# Patient Record
Sex: Female | Born: 1955 | Race: White | Hispanic: No | Marital: Married | Smoking: Never smoker
Health system: Southern US, Community
[De-identification: ages and names within clinical notes are randomized; demographics above are authoritative.]

## PROBLEM LIST (undated history)

## (undated) DIAGNOSIS — N952 Postmenopausal atrophic vaginitis: Secondary | ICD-10-CM

## (undated) DIAGNOSIS — M359 Systemic involvement of connective tissue, unspecified: Secondary | ICD-10-CM

## (undated) DIAGNOSIS — M3500 Sicca syndrome, unspecified: Secondary | ICD-10-CM

## (undated) DIAGNOSIS — I7300 Raynaud's syndrome without gangrene: Secondary | ICD-10-CM

## (undated) DIAGNOSIS — N879 Dysplasia of cervix uteri, unspecified: Secondary | ICD-10-CM

## (undated) DIAGNOSIS — M8580 Other specified disorders of bone density and structure, unspecified site: Secondary | ICD-10-CM

## (undated) HISTORY — DX: Other specified disorders of bone density and structure, unspecified site: M85.80

## (undated) HISTORY — DX: Systemic involvement of connective tissue, unspecified: M35.9

## (undated) HISTORY — DX: Dysplasia of cervix uteri, unspecified: N87.9

## (undated) HISTORY — PX: TUBAL LIGATION: SHX77

## (undated) HISTORY — DX: Postmenopausal atrophic vaginitis: N95.2

## (undated) HISTORY — PX: KNEE SURGERY: SHX244

## (undated) HISTORY — PX: CHOLECYSTECTOMY: SHX55

## (undated) HISTORY — PX: COLPOSCOPY: SHX161

## (undated) HISTORY — DX: Sjogren syndrome, unspecified: M35.00

## (undated) HISTORY — PX: BREAST EXCISIONAL BIOPSY: SUR124

## (undated) HISTORY — DX: Raynaud's syndrome without gangrene: I73.00

## (undated) HISTORY — PX: GYNECOLOGIC CRYOSURGERY: SHX857

## (undated) HISTORY — PX: APPENDECTOMY: SHX54

## (undated) HISTORY — PX: TONSILLECTOMY: SUR1361

---

## 1998-08-13 ENCOUNTER — Ambulatory Visit (HOSPITAL_COMMUNITY): Admission: RE | Admit: 1998-08-13 | Discharge: 1998-08-13 | Payer: Self-pay | Admitting: Family Medicine

## 1998-08-13 ENCOUNTER — Encounter: Payer: Self-pay | Admitting: Family Medicine

## 1999-03-09 ENCOUNTER — Ambulatory Visit (HOSPITAL_COMMUNITY): Admission: RE | Admit: 1999-03-09 | Discharge: 1999-03-09 | Payer: Self-pay | Admitting: *Deleted

## 1999-03-09 ENCOUNTER — Encounter: Payer: Self-pay | Admitting: *Deleted

## 2001-08-08 ENCOUNTER — Other Ambulatory Visit: Admission: RE | Admit: 2001-08-08 | Discharge: 2001-08-08 | Payer: Self-pay | Admitting: Obstetrics and Gynecology

## 2002-09-25 ENCOUNTER — Encounter: Payer: Self-pay | Admitting: Obstetrics and Gynecology

## 2002-09-25 ENCOUNTER — Ambulatory Visit (HOSPITAL_COMMUNITY): Admission: RE | Admit: 2002-09-25 | Discharge: 2002-09-25 | Payer: Self-pay | Admitting: Obstetrics and Gynecology

## 2002-09-30 ENCOUNTER — Other Ambulatory Visit: Admission: RE | Admit: 2002-09-30 | Discharge: 2002-09-30 | Payer: Self-pay | Admitting: Obstetrics and Gynecology

## 2003-11-04 ENCOUNTER — Other Ambulatory Visit: Admission: RE | Admit: 2003-11-04 | Discharge: 2003-11-04 | Payer: Self-pay | Admitting: Obstetrics and Gynecology

## 2004-01-13 ENCOUNTER — Ambulatory Visit (HOSPITAL_COMMUNITY): Admission: RE | Admit: 2004-01-13 | Discharge: 2004-01-13 | Payer: Self-pay | Admitting: Obstetrics and Gynecology

## 2004-12-20 ENCOUNTER — Other Ambulatory Visit: Admission: RE | Admit: 2004-12-20 | Discharge: 2004-12-20 | Payer: Self-pay | Admitting: Obstetrics and Gynecology

## 2005-01-18 ENCOUNTER — Ambulatory Visit (HOSPITAL_COMMUNITY): Admission: RE | Admit: 2005-01-18 | Discharge: 2005-01-18 | Payer: Self-pay | Admitting: Obstetrics and Gynecology

## 2006-01-05 ENCOUNTER — Other Ambulatory Visit: Admission: RE | Admit: 2006-01-05 | Discharge: 2006-01-05 | Payer: Self-pay | Admitting: Obstetrics and Gynecology

## 2006-01-20 ENCOUNTER — Ambulatory Visit (HOSPITAL_COMMUNITY): Admission: RE | Admit: 2006-01-20 | Discharge: 2006-01-20 | Payer: Self-pay | Admitting: Obstetrics and Gynecology

## 2007-01-11 ENCOUNTER — Other Ambulatory Visit: Admission: RE | Admit: 2007-01-11 | Discharge: 2007-01-11 | Payer: Self-pay | Admitting: Obstetrics and Gynecology

## 2007-01-22 ENCOUNTER — Ambulatory Visit (HOSPITAL_COMMUNITY): Admission: RE | Admit: 2007-01-22 | Discharge: 2007-01-22 | Payer: Self-pay | Admitting: Obstetrics and Gynecology

## 2008-01-23 ENCOUNTER — Encounter: Payer: Self-pay | Admitting: Obstetrics and Gynecology

## 2008-01-23 ENCOUNTER — Ambulatory Visit: Payer: Self-pay | Admitting: Obstetrics and Gynecology

## 2008-01-23 ENCOUNTER — Other Ambulatory Visit: Admission: RE | Admit: 2008-01-23 | Discharge: 2008-01-23 | Payer: Self-pay | Admitting: Obstetrics and Gynecology

## 2008-02-05 ENCOUNTER — Ambulatory Visit (HOSPITAL_COMMUNITY): Admission: RE | Admit: 2008-02-05 | Discharge: 2008-02-05 | Payer: Self-pay | Admitting: Obstetrics and Gynecology

## 2009-01-27 ENCOUNTER — Other Ambulatory Visit: Admission: RE | Admit: 2009-01-27 | Discharge: 2009-01-27 | Payer: Self-pay | Admitting: Obstetrics and Gynecology

## 2009-01-27 ENCOUNTER — Ambulatory Visit: Payer: Self-pay | Admitting: Obstetrics and Gynecology

## 2009-01-27 ENCOUNTER — Encounter: Payer: Self-pay | Admitting: Obstetrics and Gynecology

## 2009-02-13 ENCOUNTER — Ambulatory Visit (HOSPITAL_COMMUNITY): Admission: RE | Admit: 2009-02-13 | Discharge: 2009-02-13 | Payer: Self-pay | Admitting: Obstetrics and Gynecology

## 2010-02-16 ENCOUNTER — Ambulatory Visit (HOSPITAL_COMMUNITY): Admission: RE | Admit: 2010-02-16 | Discharge: 2010-02-16 | Payer: Self-pay | Admitting: Obstetrics and Gynecology

## 2010-03-11 ENCOUNTER — Other Ambulatory Visit: Admission: RE | Admit: 2010-03-11 | Discharge: 2010-03-11 | Payer: Self-pay | Admitting: Obstetrics and Gynecology

## 2010-03-11 ENCOUNTER — Ambulatory Visit: Payer: Self-pay | Admitting: Obstetrics and Gynecology

## 2011-01-12 ENCOUNTER — Other Ambulatory Visit: Payer: Self-pay | Admitting: Obstetrics and Gynecology

## 2011-01-12 DIAGNOSIS — Z1231 Encounter for screening mammogram for malignant neoplasm of breast: Secondary | ICD-10-CM

## 2011-02-18 ENCOUNTER — Ambulatory Visit (HOSPITAL_COMMUNITY)
Admission: RE | Admit: 2011-02-18 | Discharge: 2011-02-18 | Disposition: A | Discharge status: Home / Self Care | Payer: Managed Care, Other (non HMO) | Source: Ambulatory Visit | Attending: Obstetrics and Gynecology | Admitting: Obstetrics and Gynecology

## 2011-02-18 DIAGNOSIS — Z1231 Encounter for screening mammogram for malignant neoplasm of breast: Secondary | ICD-10-CM | POA: Insufficient documentation

## 2011-03-07 ENCOUNTER — Other Ambulatory Visit: Payer: Self-pay | Admitting: Dermatology

## 2012-01-18 ENCOUNTER — Other Ambulatory Visit: Payer: Self-pay | Admitting: Obstetrics and Gynecology

## 2012-01-18 DIAGNOSIS — Z1231 Encounter for screening mammogram for malignant neoplasm of breast: Secondary | ICD-10-CM

## 2012-02-21 ENCOUNTER — Ambulatory Visit (HOSPITAL_BASED_OUTPATIENT_CLINIC_OR_DEPARTMENT_OTHER)
Admission: RE | Admit: 2012-02-21 | Discharge: 2012-02-21 | Disposition: A | Discharge status: Home / Self Care | Payer: Managed Care, Other (non HMO) | Source: Ambulatory Visit | Attending: Obstetrics and Gynecology | Admitting: Obstetrics and Gynecology

## 2012-02-21 DIAGNOSIS — Z1231 Encounter for screening mammogram for malignant neoplasm of breast: Secondary | ICD-10-CM | POA: Insufficient documentation

## 2012-03-06 ENCOUNTER — Encounter: Payer: Self-pay | Admitting: Gynecology

## 2012-03-06 DIAGNOSIS — M8580 Other specified disorders of bone density and structure, unspecified site: Secondary | ICD-10-CM | POA: Insufficient documentation

## 2012-03-06 DIAGNOSIS — I7300 Raynaud's syndrome without gangrene: Secondary | ICD-10-CM | POA: Insufficient documentation

## 2012-03-06 DIAGNOSIS — N952 Postmenopausal atrophic vaginitis: Secondary | ICD-10-CM | POA: Insufficient documentation

## 2012-03-06 DIAGNOSIS — M3500 Sicca syndrome, unspecified: Secondary | ICD-10-CM | POA: Insufficient documentation

## 2012-03-06 DIAGNOSIS — M359 Systemic involvement of connective tissue, unspecified: Secondary | ICD-10-CM | POA: Insufficient documentation

## 2012-03-14 ENCOUNTER — Ambulatory Visit (INDEPENDENT_AMBULATORY_CARE_PROVIDER_SITE_OTHER): Payer: Managed Care, Other (non HMO) | Admitting: Obstetrics and Gynecology

## 2012-03-14 ENCOUNTER — Encounter: Payer: Self-pay | Admitting: Obstetrics and Gynecology

## 2012-03-14 ENCOUNTER — Other Ambulatory Visit (HOSPITAL_COMMUNITY)
Admission: RE | Admit: 2012-03-14 | Discharge: 2012-03-14 | Disposition: A | Discharge status: Home / Self Care | Payer: Managed Care, Other (non HMO) | Source: Ambulatory Visit | Attending: Obstetrics and Gynecology | Admitting: Obstetrics and Gynecology

## 2012-03-14 VITALS — BP 116/74 | Ht 64.0 in | Wt 122.0 lb

## 2012-03-14 DIAGNOSIS — N879 Dysplasia of cervix uteri, unspecified: Secondary | ICD-10-CM | POA: Insufficient documentation

## 2012-03-14 DIAGNOSIS — Z01419 Encounter for gynecological examination (general) (routine) without abnormal findings: Secondary | ICD-10-CM

## 2012-03-14 MED ORDER — ESTRADIOL 2 MG VA RING: 2.0000 mg | VAGINAL_RING | VAGINAL | Status: DC

## 2012-03-14 NOTE — Progress Notes (Signed)
Patient came to see me today for her annual GYN exam. She has been using estrogen cream for atrophic vaginitis but has trouble remembering that. She is doing fine in terms of hormonal systemic symptoms. Her last Pap smear was 2011. 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 Her last Pap was 2011. She is not having vaginal bleeding. She is not having pelvic pain. She had a bone density in 2010 showing moderate osteopenia.She is up-to-date on mammograms.  Physical examination:Monica Deleon present. HEENT within normal limits. Neck: Thyroid not large. No masses. Supraclavicular nodes: not enlarged. Breasts: Examined in both sitting and lying  position. No skin changes and no masses. Abdomen: Soft no guarding rebound or masses or hernia. Pelvic: External: Within normal limits. BUS: Within normal limits. Vaginal:within normal limits. Fair estrogen effect. No evidence of cystocele rectocele or enterocele. Cervix: clean. Uterus: Normal size and shape. Adnexa: No masses. Rectovaginal exam: Confirmatory and negative. Extremities: Within normal limits.  Assessment: Atrophic vaginitis. CIN-1. Osteopenia.  Plan: Switched her to Estring vaginal ring. Bone density. Continue yearly mammograms. Pap done.The new Pap smear guidelines were discussed with the patient.

## 2012-03-14 NOTE — Patient Instructions (Signed)
Schedule bone density.    

## 2012-03-15 LAB — URINALYSIS W MICROSCOPIC + REFLEX CULTURE
Bilirubin Urine: NEGATIVE
Casts: NONE SEEN
Hgb urine dipstick: NEGATIVE
Leukocytes, UA: NEGATIVE

## 2012-11-23 ENCOUNTER — Encounter: Payer: Self-pay | Admitting: *Deleted

## 2013-07-02 ENCOUNTER — Other Ambulatory Visit: Payer: Self-pay | Admitting: Gynecology

## 2013-07-02 DIAGNOSIS — Z1231 Encounter for screening mammogram for malignant neoplasm of breast: Secondary | ICD-10-CM

## 2013-07-03 ENCOUNTER — Ambulatory Visit (HOSPITAL_BASED_OUTPATIENT_CLINIC_OR_DEPARTMENT_OTHER)
Admission: RE | Admit: 2013-07-03 | Discharge: 2013-07-03 | Disposition: A | Discharge status: Home / Self Care | Payer: Managed Care, Other (non HMO) | Source: Ambulatory Visit | Attending: Gynecology | Admitting: Gynecology

## 2013-07-03 DIAGNOSIS — Z1231 Encounter for screening mammogram for malignant neoplasm of breast: Secondary | ICD-10-CM

## 2014-02-24 ENCOUNTER — Encounter: Payer: Self-pay | Admitting: Obstetrics and Gynecology

## 2014-08-21 ENCOUNTER — Other Ambulatory Visit (HOSPITAL_COMMUNITY)
Admission: RE | Admit: 2014-08-21 | Discharge: 2014-08-21 | Disposition: A | Discharge status: Home / Self Care | Payer: Managed Care, Other (non HMO) | Source: Ambulatory Visit | Attending: Gynecology | Admitting: Gynecology

## 2014-08-21 ENCOUNTER — Encounter: Payer: Self-pay | Admitting: Women's Health

## 2014-08-21 ENCOUNTER — Ambulatory Visit (INDEPENDENT_AMBULATORY_CARE_PROVIDER_SITE_OTHER): Payer: Managed Care, Other (non HMO) | Admitting: Women's Health

## 2014-08-21 VITALS — BP 120/76 | Ht 64.0 in | Wt 126.0 lb

## 2014-08-21 DIAGNOSIS — Z79890 Hormone replacement therapy (postmenopausal): Secondary | ICD-10-CM | POA: Diagnosis not present

## 2014-08-21 DIAGNOSIS — Z01419 Encounter for gynecological examination (general) (routine) without abnormal findings: Secondary | ICD-10-CM

## 2014-08-21 DIAGNOSIS — M8580 Other specified disorders of bone density and structure, unspecified site: Secondary | ICD-10-CM

## 2014-08-21 DIAGNOSIS — Z13820 Encounter for screening for osteoporosis: Secondary | ICD-10-CM

## 2014-08-21 MED ORDER — ESTRADIOL 2 MG VA RING: 2.0000 mg | VAGINAL_RING | VAGINAL | Status: DC

## 2014-08-21 NOTE — Patient Instructions (Signed)
Health Recommendations for Postmenopausal Women Respected and ongoing research has looked at the most common causes of death, disability, and poor quality of life in postmenopausal women. The causes include heart disease, diseases of blood vessels, diabetes, depression, cancer, and bone loss (osteoporosis). Many things can be done to help lower the chances of developing these and other common problems. CARDIOVASCULAR DISEASE Heart Disease: A heart attack is a medical emergency. Know the signs and symptoms of a heart attack. Below are things women can do to reduce their risk for heart disease.   Do not smoke. If you smoke, quit.  Aim for a healthy weight. Being overweight causes many preventable deaths. Eat a healthy and balanced diet and drink an adequate amount of liquids.  Get moving. Make a commitment to be more physically active. Aim for 30 minutes of activity on most, if not all days of the week.  Eat for heart health. Choose a diet that is low in saturated fat and cholesterol and eliminate trans fat. Include whole grains, vegetables, and fruits. Read and understand the labels on food containers before buying.  Know your numbers. Ask your caregiver to check your blood pressure, cholesterol (total, HDL, LDL, triglycerides) and blood glucose. Work with your caregiver on improving your entire clinical picture.  High blood pressure. Limit or stop your table salt intake (try salt substitute and food seasonings). Avoid salty foods and drinks. Read labels on food containers before buying. Eating well and exercising can help control high blood pressure. STROKE  Stroke is a medical emergency. Stroke may be the result of a blood clot in a blood vessel in the brain or by a brain hemorrhage (bleeding). Know the signs and symptoms of a stroke. To lower the risk of developing a stroke:  Avoid fatty foods.  Quit smoking.  Control your diabetes, blood pressure, and irregular heart rate. THROMBOPHLEBITIS  (BLOOD CLOT) OF THE LEG  Becoming overweight and leading a stationary lifestyle may also contribute to developing blood clots. Controlling your diet and exercising will help lower the risk of developing blood clots. CANCER SCREENING  Breast Cancer: Take steps to reduce your risk of breast cancer.  You should practice "breast self-awareness." This means understanding the normal appearance and feel of your breasts and should include breast self-examination. Any changes detected, no matter how small, should be reported to your caregiver.  After age 40, you should have a clinical breast exam (CBE) every year.  Starting at age 40, you should consider having a mammogram (breast X-ray) every year.  If you have a family history of breast cancer, talk to your caregiver about genetic screening.  If you are at high risk for breast cancer, talk to your caregiver about having an MRI and a mammogram every year.  Intestinal or Stomach Cancer: Tests to consider are a rectal exam, fecal occult blood, sigmoidoscopy, and colonoscopy. Women who are high risk may need to be screened at an earlier age and more often.  Cervical Cancer:  Beginning at age 30, you should have a Pap test every 3 years as long as the past 3 Pap tests have been normal.  If you have had past treatment for cervical cancer or a condition that could lead to cancer, you need Pap tests and screening for cancer for at least 20 years after your treatment.  If you had a hysterectomy for a problem that was not cancer or a condition that could lead to cancer, then you no longer need Pap tests.    If you are between ages 65 and 70, and you have had normal Pap tests going back 10 years, you no longer need Pap tests.  If Pap tests have been discontinued, risk factors (such as a new sexual partner) need to be reassessed to determine if screening should be resumed.  Some medical problems can increase the chance of getting cervical cancer. In these  cases, your caregiver may recommend more frequent screening and Pap tests.  Uterine Cancer: If you have vaginal bleeding after reaching menopause, you should notify your caregiver.  Ovarian Cancer: Other than yearly pelvic exams, there are no reliable tests available to screen for ovarian cancer at this time except for yearly pelvic exams.  Lung Cancer: Yearly chest X-rays can detect lung cancer and should be done on high risk women, such as cigarette smokers and women with chronic lung disease (emphysema).  Skin Cancer: A complete body skin exam should be done at your yearly examination. Avoid overexposure to the sun and ultraviolet light lamps. Use a strong sun block cream when in the sun. All of these things are important for lowering the risk of skin cancer. MENOPAUSE Menopause Symptoms: Hormone therapy products are effective for treating symptoms associated with menopause:  Moderate to severe hot flashes.  Night sweats.  Mood swings.  Headaches.  Tiredness.  Loss of sex drive.  Insomnia.  Other symptoms. Hormone replacement carries certain risks, especially in older women. Women who use or are thinking about using estrogen or estrogen with progestin treatments should discuss that with their caregiver. Your caregiver will help you understand the benefits and risks. The ideal dose of hormone replacement therapy is not known. The Food and Drug Administration (FDA) has concluded that hormone therapy should be used only at the lowest doses and for the shortest amount of time to reach treatment goals.  OSTEOPOROSIS Protecting Against Bone Loss and Preventing Fracture If you use hormone therapy for prevention of bone loss (osteoporosis), the risks for bone loss must outweigh the risk of the therapy. Ask your caregiver about other medications known to be safe and effective for preventing bone loss and fractures. To guard against bone loss or fractures, the following is recommended:  If  you are younger than age 50, take 1000 mg of calcium and at least 600 mg of Vitamin D per day.  If you are older than age 50 but younger than age 70, take 1200 mg of calcium and at least 600 mg of Vitamin D per day.  If you are older than age 70, take 1200 mg of calcium and at least 800 mg of Vitamin D per day. Smoking and excessive alcohol intake increases the risk of osteoporosis. Eat foods rich in calcium and vitamin D and do weight bearing exercises several times a week as your caregiver suggests. DIABETES Diabetes Mellitus: If you have type I or type 2 diabetes, you should keep your blood sugar under control with diet, exercise, and recommended medication. Avoid starchy and fatty foods, and too many sweets. Being overweight can make diabetes control more difficult. COGNITION AND MEMORY Cognition and Memory: Menopausal hormone therapy is not recommended for the prevention of cognitive disorders such as Alzheimer's disease or memory loss.  DEPRESSION  Depression may occur at any age, but it is common in elderly women. This may be because of physical, medical, social (loneliness), or financial problems and needs. If you are experiencing depression because of medical problems and control of symptoms, talk to your caregiver about this. Physical   activity and exercise may help with mood and sleep. Community and volunteer involvement may improve your sense of value and worth. If you have depression and you feel that the problem is getting worse or becoming severe, talk to your caregiver about which treatment options are [place] for you. ACCIDENTS  Accidents are common and can be serious in elderly woman. Prepare your house to prevent accidents. Eliminate throw rugs, place hand bars in bath, shower, and toilet areas. Avoid wearing high heeled shoes or walking on wet, snowy, and icy areas. Limit or stop driving if you have vision or hearing problems, or if you feel you are unsteady with your movements and  reflexes. HEPATITIS C Hepatitis C is a type of viral infection affecting the liver. It is spread mainly through contact with blood from an infected person. It can be treated, but if left untreated, it can lead to severe liver damage over the years. Many people who are infected do not know that the virus is in their blood. If you are a "baby-boomer", it is recommended that you have one screening test for Hepatitis C. IMMUNIZATIONS  Several immunizations are important to consider having during your senior years, including:   Tetanus, diphtheria, and pertussis booster shot.  Influenza every year before the flu season begins.  Pneumonia vaccine.  Shingles vaccine.  Others, as indicated based on your specific needs. Talk to your caregiver about these. Document Released: 06/03/2005 Document Revised: 08/26/2013 Document Reviewed: 01/28/2008 ExitCare Patient Information 2015 [employer]. This information is not intended to replace advice given to you by your health care provider. Make sure you discuss any questions you have with your health care provider.  

## 2014-08-21 NOTE — Progress Notes (Signed)
[place] Aug 08, 1955 [id number]    History:    Presents for annual exam.  Postmenopausal/no HRT/no bleeding. Had been on estrogen cream had stopped/ ran out of the prescription, increased vaginal dryness. Last here 2013. Had normal labs at work overall cholesterol 132, glucose 85. Rheumatologist also does labs at have been normal. 2010 T score -0.2 at spine, left femoral neck -1.6 FRAX 4.8%/0.5%. Raynaud's syndrome connective tissue disease and Sjogren's syndrome. Negative colonoscopy age 59  Past medical history, past surgical history, family history and social history were all reviewed and documented in the EPIC chart. 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 History of BTL, appendectomy, cholecystectomy and tonsillectomy.  ROS:  A ROS was performed and pertinent positives and negatives are included.  Exam:  Filed Vitals:   08/21/14 0810  BP: 120/76    General appearance:  Normal Thyroid:  Symmetrical, normal in size, without palpable masses or nodularity. Respiratory  Auscultation:  Clear without wheezing or rhonchi Cardiovascular  Auscultation:  Regular rate, without rubs, murmurs or gallops  Edema/varicosities:  Not grossly evident Abdominal  Soft,nontender, without masses, guarding or rebound.  Liver/spleen:  No organomegaly noted  Hernia:  None appreciated  Skin  Inspection:  Grossly normal   Breasts: Examined lying and sitting.     Right: Without masses, retractions, discharge or axillary adenopathy.     Left: Without masses, retractions, discharge or axillary adenopathy. Gentitourinary   Inguinal/mons:  Normal without inguinal adenopathy  External genitalia:  Normal  BUS/Urethra/Skene's glands:  Normal  Vagina: Atrophic  Cervix:  Normal  Uterus: normal in size, shape and contour.  Midline and mobile  Adnexa/parametria:     Rt: Without masses or tenderness.   Lt: Without masses or tenderness.  Anus and perineum: Normal  Digital rectal exam: Normal  sphincter tone without palpated masses or tenderness  Assessment/Plan:  59 y.o. MWF G2P2 for annual exam with complaint of vaginal dryness and intermittent upper abdominal pain.  Atrophic vaginitis Osteopenia without elevated FRAX Raynaud's syndrome connective tissue disorder, and Sjogren's syndrome managed by rheumatologist Upper abdominal pain  Plan: Repeat DEXA, will schedule,  Requested abdominal CT scan, [facility]. SBE's, continue annual screening mammogram, 3-D tomography reviewed and encouraged history of dense breasts. Vaginal dryness discussed, will try Estring, prescription, proper use given and reviewed will call if no relief of dryness, continue vaginal lubricants with intercourse. Reviewed minimal systemic absorption, slight risk for blood clots and strokes. Home safety, fall prevention and importance of continued regular exercise. Labs primary care and rheumatologist. UA, Pap with HR HPV typing, new screening guidelines reviewed.       Harrington ChallengerYOUNG,NANCY J [facility] CtrWHNP, 10:39 AM 08/21/2014

## 2014-08-22 LAB — URINALYSIS W MICROSCOPIC + REFLEX CULTURE
BILIRUBIN URINE: NEGATIVE
Bacteria, UA: NONE SEEN
CASTS: NONE SEEN
CRYSTALS: NONE SEEN
Glucose, UA: NEGATIVE mg/dL
HGB URINE DIPSTICK: NEGATIVE
KETONES UR: NEGATIVE mg/dL
Leukocytes, UA: NEGATIVE
NITRITE: NEGATIVE
PH: 6 (ref 5.0–8.0)
Protein, ur: NEGATIVE mg/dL
Specific Gravity, Urine: <1.005 — ABNORMAL LOW (ref 1.005–1.030)
Squamous Epithelial / LPF: NONE SEEN
Urobilinogen, UA: 0.2 mg/dL (ref 0.0–1.0)

## 2014-08-22 LAB — CYTOLOGY - PAP

## 2014-08-27 ENCOUNTER — Telehealth: Payer: Self-pay | Admitting: *Deleted

## 2014-08-27 ENCOUNTER — Other Ambulatory Visit: Payer: Self-pay | Admitting: Women's Health

## 2014-08-27 DIAGNOSIS — G8929 Other chronic pain: Secondary | ICD-10-CM

## 2014-08-27 DIAGNOSIS — R1011 Right upper quadrant pain: Principal | ICD-10-CM

## 2014-08-27 DIAGNOSIS — Z1231 Encounter for screening mammogram for malignant neoplasm of breast: Secondary | ICD-10-CM

## 2014-08-27 NOTE — Telephone Encounter (Signed)
-----   Message from Harrington ChallengerNancy J Young, NP sent at 08/22/2014  1:52 PM EDT ----- Please schedule CT with contrast of abdomen, has diffuse intermittent abdominal pain for past 2 months, minimal pelvic discomfort. No GI or GU symptoms. Can go most any time for appt..  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

## 2014-08-27 NOTE — Telephone Encounter (Signed)
Appointment scheduled on 09/02/14 @ 10:30am check in at 10:15am, NPO 4 hours prior to scan, will need to pick up contrast with instructions on when to drink contrast. Pt aware with this as well.

## 2014-09-02 ENCOUNTER — Telehealth: Payer: Self-pay

## 2014-09-02 ENCOUNTER — Ambulatory Visit (HOSPITAL_COMMUNITY): Payer: Managed Care, Other (non HMO)

## 2014-09-02 NOTE — Telephone Encounter (Signed)
I called [employer] and set up case for prior authorization. Clinical notes faxed to them.  Today I received notice of prior auth #[id number] valid until 12/01/14.

## 2014-09-05 ENCOUNTER — Ambulatory Visit (HOSPITAL_COMMUNITY)
Admission: RE | Admit: 2014-09-05 | Discharge: 2014-09-05 | Disposition: A | Discharge status: Home / Self Care | Payer: Managed Care, Other (non HMO) | Source: Ambulatory Visit | Attending: Women's Health | Admitting: Women's Health

## 2014-09-05 DIAGNOSIS — Z780 Asymptomatic menopausal state: Secondary | ICD-10-CM | POA: Diagnosis not present

## 2014-09-05 DIAGNOSIS — Z1231 Encounter for screening mammogram for malignant neoplasm of breast: Secondary | ICD-10-CM | POA: Insufficient documentation

## 2014-09-05 DIAGNOSIS — M8580 Other specified disorders of bone density and structure, unspecified site: Secondary | ICD-10-CM

## 2014-09-05 DIAGNOSIS — Z13820 Encounter for screening for osteoporosis: Secondary | ICD-10-CM | POA: Insufficient documentation

## 2014-09-10 ENCOUNTER — Encounter (HOSPITAL_COMMUNITY): Payer: Self-pay

## 2014-09-10 ENCOUNTER — Ambulatory Visit (HOSPITAL_COMMUNITY)
Admission: RE | Admit: 2014-09-10 | Discharge: 2014-09-10 | Disposition: A | Discharge status: Home / Self Care | Payer: Managed Care, Other (non HMO) | Source: Ambulatory Visit | Attending: Women's Health | Admitting: Women's Health

## 2014-09-10 DIAGNOSIS — G8929 Other chronic pain: Secondary | ICD-10-CM | POA: Diagnosis not present

## 2014-09-10 DIAGNOSIS — R1010 Upper abdominal pain, unspecified: Secondary | ICD-10-CM | POA: Diagnosis not present

## 2014-09-10 DIAGNOSIS — R1011 Right upper quadrant pain: Secondary | ICD-10-CM

## 2014-09-10 MED ORDER — IOHEXOL 300 MG/ML  SOLN
100.0000 mL | Freq: Once | INTRAMUSCULAR | Status: AC | PRN
  Administered 2014-09-10: 100 mL via INTRAVENOUS

## 2014-10-24 ENCOUNTER — Telehealth: Payer: Self-pay | Admitting: Women's Health

## 2014-10-24 NOTE — Telephone Encounter (Signed)
Telephone call to review DEXA. Does see a rheumatologist also. AP spine T score -0.5, left femoral neck -2.1 had been -1.6  6 years ago. Has a very active lifestyle with sports and regular exercise. We'll continue regular exercise, calcium rich diet will have a vitamin D checked in July with rheumatologist. Home safety and fall prevention discussed.

## 2015-10-15 ENCOUNTER — Other Ambulatory Visit: Payer: Self-pay | Admitting: Women's Health

## 2015-10-15 DIAGNOSIS — Z1231 Encounter for screening mammogram for malignant neoplasm of breast: Secondary | ICD-10-CM

## 2015-11-02 ENCOUNTER — Ambulatory Visit: Payer: Managed Care, Other (non HMO)

## 2015-11-03 ENCOUNTER — Ambulatory Visit: Payer: Managed Care, Other (non HMO)

## 2015-11-17 ENCOUNTER — Ambulatory Visit
Admission: RE | Admit: 2015-11-17 | Discharge: 2015-11-17 | Disposition: A | Discharge status: Home / Self Care | Payer: Managed Care, Other (non HMO) | Source: Ambulatory Visit | Attending: Women's Health | Admitting: Women's Health

## 2015-11-17 DIAGNOSIS — Z1231 Encounter for screening mammogram for malignant neoplasm of breast: Secondary | ICD-10-CM

## 2016-12-22 ENCOUNTER — Other Ambulatory Visit: Payer: Self-pay | Admitting: Women's Health

## 2016-12-22 DIAGNOSIS — Z1231 Encounter for screening mammogram for malignant neoplasm of breast: Secondary | ICD-10-CM

## 2017-01-04 ENCOUNTER — Ambulatory Visit
Admission: RE | Admit: 2017-01-04 | Discharge: 2017-01-04 | Disposition: A | Discharge status: Home / Self Care | Payer: Managed Care, Other (non HMO) | Source: Ambulatory Visit | Attending: Women's Health | Admitting: Women's Health

## 2017-01-04 DIAGNOSIS — Z1231 Encounter for screening mammogram for malignant neoplasm of breast: Secondary | ICD-10-CM

## 2018-01-23 ENCOUNTER — Other Ambulatory Visit: Payer: Self-pay | Admitting: Women's Health

## 2018-01-23 DIAGNOSIS — Z1231 Encounter for screening mammogram for malignant neoplasm of breast: Secondary | ICD-10-CM

## 2018-02-26 ENCOUNTER — Ambulatory Visit
Admission: RE | Admit: 2018-02-26 | Discharge: 2018-02-26 | Disposition: A | Discharge status: Home / Self Care | Payer: 59 | Source: Ambulatory Visit | Attending: Women's Health | Admitting: Women's Health

## 2018-02-26 DIAGNOSIS — Z1231 Encounter for screening mammogram for malignant neoplasm of breast: Secondary | ICD-10-CM

## 2018-07-03 ENCOUNTER — Ambulatory Visit (INDEPENDENT_AMBULATORY_CARE_PROVIDER_SITE_OTHER): Payer: 59 | Admitting: Women's Health

## 2018-07-03 ENCOUNTER — Encounter: Payer: Self-pay | Admitting: Women's Health

## 2018-07-03 VITALS — BP 124/80 | Ht 64.0 in | Wt 132.0 lb

## 2018-07-03 DIAGNOSIS — Z01419 Encounter for gynecological examination (general) (routine) without abnormal findings: Secondary | ICD-10-CM

## 2018-07-03 DIAGNOSIS — Z13820 Encounter for screening for osteoporosis: Secondary | ICD-10-CM | POA: Diagnosis not present

## 2018-07-03 MED ORDER — ESTRADIOL 10 MCG VA TABS: ORAL_TABLET | VAGINAL | 4 refills | Status: DC

## 2018-07-03 NOTE — Progress Notes (Signed)
Monica Deleon 1955-04-30 [id number]    History:    Presents for annual exam.  Menopausal on no HRT with no bleeding.  Normal Pap and mammogram history.  Labs at primary care.  Negative colonoscopy at age 63.  Sjogren's and Raynaud  primary care manages.  Has not had Shingrix.  Past medical history, past surgical history, family history and social history were all reviewed and documented in the EPIC chart.  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  ROS:  A ROS was performed and pertinent positives and negatives are included.  Exam:  Vitals:   07/03/18 1100  BP: 124/80  Weight: 132 lb (59.9 kg)  Height: 5\' 4"  (1.626 m)   Body mass index is 22.66 kg/m.   General appearance:  Normal Thyroid:  Symmetrical, normal in size, without palpable masses or nodularity. Respiratory  Auscultation:  Clear without wheezing or rhonchi Cardiovascular  Auscultation:  Regular rate, without rubs, murmurs or gallops  Edema/varicosities:  Not grossly evident Abdominal  Soft,nontender, without masses, guarding or rebound.  Liver/spleen:  No organomegaly noted  Hernia:  None appreciated  Skin  Inspection:  Grossly normal   Breasts: Examined lying and sitting.     Right: Without masses, retractions, discharge or axillary adenopathy.     Left: Without masses, retractions, discharge or axillary adenopathy. Gentitourinary   Inguinal/mons:  Normal without inguinal adenopathy  External genitalia:  Normal  BUS/Urethra/Skene's glands:  Normal  Vagina: Atrophic  Cervix:  Normal  Uterus:   normal in size, shape and contour.  Midline and mobile  Adnexa/parametria:     Rt: Without masses or tenderness.   Lt: Without masses or tenderness.  Anus and perineum: Normal  Digital rectal exam: Normal sphincter tone without palpated masses or tenderness  Assessment/Plan:  63 y.o. MWF G2 P2 for annual exam with complaint of vaginal dryness.  Postmenopausal on no HRT with no bleeding Vaginal  atrophy/dyspareunia Sjogren's and Raynaud-primary care manages labs and meds  Plan: Options for vaginal dryness reviewed, Vagifem 1 applicator at bedtime for 2 weeks and then twice weekly per vagina.  Continue over-the-counter vaginal lubricants with intercourse.  SBEs, continue annual screening mammogram, calcium rich foods, vitamin D 2000 daily encouraged.  Home safety, fall prevention and importance of weightbearing and balance type exercise reviewed and encouraged.  Shingrix discussed and Courage.  Will repeat DEXA with next mammogram.  Pap with HR HPV typing, new screening guidelines reviewed.   Harrington Challenger [facility], 1:14 PM 07/03/2018

## 2018-07-03 NOTE — Patient Instructions (Signed)
[facility]  Health Maintenance for Postmenopausal Women Menopause is a normal process in which your reproductive ability comes to an end. This process happens gradually over a span of months to years, usually between the ages of 69 and 60. Menopause is complete when you have missed 12 consecutive menstrual periods. It is important to talk with your health care provider about some of the most common conditions that affect postmenopausal women, such as heart disease, cancer, and bone loss (osteoporosis). Adopting a healthy lifestyle and getting preventive care can help to promote your health and wellness. Those actions can also lower your chances of developing some of these common conditions. What should I know about menopause? During menopause, you may experience a number of symptoms, such as:  Moderate-to-severe hot flashes.  Night sweats.  Decrease in sex drive.  Mood swings.  Headaches.  Tiredness.  Irritability.  Memory problems.  Insomnia. Choosing to treat or not to treat menopausal changes is an individual decision that you make with your health care provider. What should I know about hormone replacement therapy and supplements? Hormone therapy products are effective for treating symptoms that are associated with menopause, such as hot flashes and night sweats. Hormone replacement carries certain risks, especially as you become older. If you are thinking about using estrogen or estrogen with progestin treatments, discuss the benefits and risks with your health care provider. What should I know about heart disease and stroke? Heart disease, heart attack, and stroke become more likely as you age. This may be due, in part, to the hormonal changes that your body experiences during menopause. These can affect how your body processes dietary fats, triglycerides, and cholesterol. Heart attack and stroke are both medical emergencies. There are many things that you can do to help prevent  heart disease and stroke:  Have your blood pressure checked at least every 1-2 years. High blood pressure causes heart disease and increases the risk of stroke.  If you are 32-66 years old, ask your health care provider if you should take aspirin to prevent a heart attack or a stroke.  Do not use any tobacco products, including cigarettes, chewing tobacco, or electronic cigarettes. If you need help quitting, ask your health care provider.  It is important to eat a healthy diet and maintain a healthy weight. ? Be sure to include plenty of vegetables, fruits, low-fat dairy products, and lean protein. ? Avoid eating foods that are high in solid fats, added sugars, or salt (sodium).  Get regular exercise. This is one of the most important things that you can do for your health. ? Try to exercise for at least 150 minutes each week. The type of exercise that you do should increase your heart rate and make you sweat. This is known as moderate-intensity exercise. ? Try to do strengthening exercises at least twice each week. Do these in addition to the moderate-intensity exercise.  Know your numbers.Ask your health care provider to check your cholesterol and your blood glucose. Continue to have your blood tested as directed by your health care provider.  What should I know about cancer screening? There are several types of cancer. Take the following steps to reduce your risk and to catch any cancer development as early as possible. Breast Cancer  Practice breast self-awareness. ? This means understanding how your breasts normally appear and feel. ? It also means doing regular breast self-exams. Let your health care provider know about any changes, no matter how small.  If you are  40 or older, have a clinician do a breast exam (clinical breast exam or CBE) every year. Depending on your age, family history, and medical history, it may be recommended that you also have a yearly breast X-ray  (mammogram).  If you have a family history of breast cancer, talk with your health care provider about genetic screening.  If you are at high risk for breast cancer, talk with your health care provider about having an MRI and a mammogram every year.  Breast cancer (BRCA) gene test is recommended for women who have family members with BRCA-related cancers. Results of the assessment will determine the need for genetic counseling and BRCA1 and for BRCA2 testing. BRCA-related cancers include these types: ? Breast. This occurs in males or females. ? Ovarian. ? Tubal. This may also be called fallopian tube cancer. ? Cancer of the abdominal or pelvic lining (peritoneal cancer). ? Prostate. ? Pancreatic. Cervical, Uterine, and Ovarian Cancer Your health care provider may recommend that you be screened regularly for cancer of the pelvic organs. These include your ovaries, uterus, and vagina. This screening involves a pelvic exam, which includes checking for microscopic changes to the surface of your cervix (Pap test).  For women ages 21-65, health care providers may recommend a pelvic exam and a Pap test every three years. For women ages 30-65, they may recommend the Pap test and pelvic exam, combined with testing for human papilloma virus (HPV), every five years. Some types of HPV increase your risk of cervical cancer. Testing for HPV may also be done on women of any age who have unclear Pap test results.  Other health care providers may not recommend any screening for nonpregnant women who are considered low risk for pelvic cancer and have no symptoms. Ask your health care provider if a screening pelvic exam is right for you.  If you have had past treatment for cervical cancer or a condition that could lead to cancer, you need Pap tests and screening for cancer for at least 20 years after your treatment. If Pap tests have been discontinued for you, your risk factors (such as having a new sexual partner)  need to be reassessed to determine if you should start having screenings again. Some women have medical problems that increase the chance of getting cervical cancer. In these cases, your health care provider may recommend that you have screening and Pap tests more often.  If you have a family history of uterine cancer or ovarian cancer, talk with your health care provider about genetic screening.  If you have vaginal bleeding after reaching menopause, tell your health care provider.  There are currently no reliable tests available to screen for ovarian cancer. Lung Cancer Lung cancer screening is recommended for adults 55-80 years old who are at high risk for lung cancer because of a history of smoking. A yearly low-dose CT scan of the lungs is recommended if you:  Currently smoke.  Have a history of at least 30 pack-years of smoking and you currently smoke or have quit within the past 15 years. A pack-year is smoking an average of one pack of cigarettes per day for one year. Yearly screening should:  Continue until it has been 15 years since you quit.  Stop if you develop a health problem that would prevent you from having lung cancer treatment. Colorectal Cancer  This type of cancer can be detected and can often be prevented.  Routine colorectal cancer screening usually begins at age 50 and   continues through age 77.  If you have risk factors for colon cancer, your health care provider may recommend that you be screened at an earlier age.  If you have a family history of colorectal cancer, talk with your health care provider about genetic screening.  Your health care provider may also recommend using home test kits to check for hidden blood in your stool.  A small camera at the end of a tube can be used to examine your colon directly (sigmoidoscopy or colonoscopy). This is done to check for the earliest forms of colorectal cancer.  Direct examination of the colon should be repeated  every 5-10 years until age 32. However, if early forms of precancerous polyps or small growths are found or if you have a family history or genetic risk for colorectal cancer, you may need to be screened more often. Skin Cancer  Check your skin from head to toe regularly.  Monitor any moles. Be sure to tell your health care provider: ? About any new moles or changes in moles, especially if there is a change in a mole's shape or color. ? If you have a mole that is larger than the size of a pencil eraser.  If any of your family members has a history of skin cancer, especially at a Monica Deleon age, talk with your health care provider about genetic screening.  Always use sunscreen. Apply sunscreen liberally and repeatedly throughout the day.  Whenever you are outside, protect yourself by wearing long sleeves, pants, a wide-brimmed hat, and sunglasses. What should I know about osteoporosis? Osteoporosis is a condition in which bone destruction happens more quickly than new bone creation. After menopause, you may be at an increased risk for osteoporosis. To help prevent osteoporosis or the bone fractures that can happen because of osteoporosis, the following is recommended:  If you are 26-72 years old, get at least 1,000 mg of calcium and at least 600 mg of vitamin D per day.  If you are older than age 66 but younger than age 39, get at least 1,200 mg of calcium and at least 600 mg of vitamin D per day.  If you are older than age 83, get at least 1,200 mg of calcium and at least 800 mg of vitamin D per day. Smoking and excessive alcohol intake increase the risk of osteoporosis. Eat foods that are rich in calcium and vitamin D, and do weight-bearing exercises several times each week as directed by your health care provider. What should I know about how menopause affects my mental health? Depression may occur at any age, but it is more common as you become older. Common symptoms of depression  include:  Low or sad mood.  Changes in sleep patterns.  Changes in appetite or eating patterns.  Feeling an overall lack of motivation or enjoyment of activities that you previously enjoyed.  Frequent crying spells. Talk with your health care provider if you think that you are experiencing depression. What should I know about immunizations? It is important that you get and maintain your immunizations. These include:  Tetanus, diphtheria, and pertussis (Tdap) booster vaccine.  Influenza every year before the flu season begins.  Pneumonia vaccine.  Shingles vaccine. Your health care provider may also recommend other immunizations. This information is not intended to replace advice given to you by your health care provider. Make sure you discuss any questions you have with your health care provider. Document Released: 06/03/2005 Document Revised: 10/30/2015 Document Reviewed: 01/13/2015 Elsevier Interactive  Patient Education  2019 [employer].

## 2018-07-04 LAB — PAP, TP IMAGING W/ HPV RNA, RFLX HPV TYPE 16,18/45: HPV DNA HIGH RISK: NOT DETECTED

## 2018-07-04 LAB — URINALYSIS, COMPLETE W/RFL CULTURE
BACTERIA UA: NONE SEEN /HPF
BILIRUBIN URINE: NEGATIVE
Glucose, UA: NEGATIVE
HYALINE CAST: NONE SEEN /LPF
Hgb urine dipstick: NEGATIVE
Ketones, ur: NEGATIVE
Leukocyte Esterase: NEGATIVE
NITRITES URINE, INITIAL: NEGATIVE
PROTEIN: NEGATIVE
RBC / HPF: NONE SEEN /HPF (ref 0–2)
SPECIFIC GRAVITY, URINE: 1.016 (ref 1.001–1.03)
SQUAMOUS EPITHELIAL / LPF: NONE SEEN /HPF (ref <=5)
WBC UA: NONE SEEN /HPF (ref 0–5)
pH: 6 (ref 5.0–8.0)

## 2018-07-04 LAB — NO CULTURE INDICATED

## 2019-02-13 ENCOUNTER — Other Ambulatory Visit: Payer: Self-pay | Admitting: Women's Health

## 2019-02-13 DIAGNOSIS — Z1231 Encounter for screening mammogram for malignant neoplasm of breast: Secondary | ICD-10-CM

## 2019-04-02 ENCOUNTER — Ambulatory Visit
Admission: RE | Admit: 2019-04-02 | Discharge: 2019-04-02 | Disposition: A | Discharge status: Home / Self Care | Payer: 59 | Source: Ambulatory Visit | Attending: Women's Health | Admitting: Women's Health

## 2019-04-02 ENCOUNTER — Other Ambulatory Visit: Payer: Self-pay

## 2019-04-02 DIAGNOSIS — Z1231 Encounter for screening mammogram for malignant neoplasm of breast: Secondary | ICD-10-CM

## 2019-07-09 ENCOUNTER — Encounter: Payer: 59 | Admitting: Women's Health

## 2019-08-05 ENCOUNTER — Other Ambulatory Visit: Payer: Self-pay

## 2019-08-06 ENCOUNTER — Encounter: Payer: Self-pay | Admitting: Women's Health

## 2019-08-06 ENCOUNTER — Other Ambulatory Visit: Payer: Self-pay | Admitting: Women's Health

## 2019-08-06 ENCOUNTER — Ambulatory Visit (INDEPENDENT_AMBULATORY_CARE_PROVIDER_SITE_OTHER): Payer: 59 | Admitting: Women's Health

## 2019-08-06 VITALS — BP 128/80 | Ht 64.0 in | Wt 130.0 lb

## 2019-08-06 DIAGNOSIS — Z13820 Encounter for screening for osteoporosis: Secondary | ICD-10-CM | POA: Diagnosis not present

## 2019-08-06 DIAGNOSIS — Z01419 Encounter for gynecological examination (general) (routine) without abnormal findings: Secondary | ICD-10-CM

## 2019-08-06 MED ORDER — NONFORMULARY OR COMPOUNDED ITEM: 3 refills | Status: DC

## 2019-08-06 MED ORDER — PREMARIN 0.625 MG/GM VA CREA: 1.0000 | TOPICAL_CREAM | Freq: Every day | VAGINAL | 12 refills | Status: DC

## 2019-08-06 NOTE — Progress Notes (Signed)
[place] D Winther 1955/06/19 [id number]    History:    Presents for annual exam.  Postmenopausal has been on Vagifem with moderate relief of dryness stopped due to expense.  Would like to try vaginal cream.  Normal mammogram history.  Abnormal Pap many years ago not requiring intervention.  2011 colonoscopy no polyps has scheduled follow-up in November.  Has had the first Covid vaccine.  Primary care manages labs.  History of Raynaud's and Sjogren's.  08/2014 T score -1.5 FRAX 15% / 1.3% .  Past medical history, past surgical history, family history and social history were all reviewed and documented in the EPIC chart.  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  ROS:  A ROS was performed and pertinent positives and negatives are included.  Exam:  Vitals:   08/06/19 0858  BP: 128/80  Weight: 130 lb (59 kg)  Height: 5\' 4"  (1.626 m)   Body mass index is 22.31 kg/m.   General appearance:  Normal Thyroid:  Symmetrical, normal in size, without palpable masses or nodularity. Respiratory  Auscultation:  Clear without wheezing or rhonchi Cardiovascular  Auscultation:  Regular rate, without rubs, murmurs or gallops  Edema/varicosities:  Not grossly evident Abdominal  Soft,nontender, without masses, guarding or rebound.  Liver/spleen:  No organomegaly noted  Hernia:  None appreciated  Skin  Inspection:  Grossly normal   Breasts: Examined lying and sitting.     Right: Without masses, retractions, discharge or axillary adenopathy.    Left: Without masses, retractions, discharge or axillary adenopathy. Gentitourinary   Inguinal/mons:  Normal without inguinal adenopathy  External genitalia:  Normal  BUS/Urethra/Skene's glands:  Normal  Vagina: Mild vaginal atrophy   Cervix:  Normal  Uterus:  normal in size, shape and contour.  Midline and mobile  Adnexa/parametria:     Rt: Without masses or tenderness.   Lt: Without masses or tenderness.  Anus and  perineum: Normal  Digital rectal exam: Normal sphincter tone without palpated masses or tenderness  Assessment/Plan:  64 y.o. MWF G2 P2 for annual exam with no complaints of discharge, abdominal pain or urinary symptoms.  Postmenopausal no HRT with vaginal dryness/dyspareunia Raynaud's, Sjogren's-rheumatologist manages 08/2014 osteopenia without elevated FRAX Labs-primary care  Plan: Dyspareunia discussed, coupon card for Premarin vaginal cream given insert 0.625 mg 2-3 times weekly, reviewed minimal systemic absorption.  Continue with over-the-counter vaginal lubricants.  Instructed to call if coupon card does not work and if no relief.  Repeat DEXA last one at breast center will schedule.  SBEs, exercise, calcium rich foods, vitamin D 2000 IUs daily encouraged.  Reviewed importance of weightbearing and balance type exercise.,  Yoga encouraged.  Shingrix reviewed and encouraged.  Pap normal 2020, new screening guidelines reviewed.   09/2014 [facility], 9:30 AM 08/06/2019

## 2019-08-06 NOTE — Telephone Encounter (Signed)
Pharmacy contacted [place].  Premarin vag cream in non formulary for patient. She would like a vaginal cream.  THe vagifem is the only formulary vaginal estrogen.  Do you want to prescribe the compound Estradiol Cream for her? I can call and discuss with her if you do. thanks

## 2019-08-06 NOTE — Telephone Encounter (Signed)
Monica Deleon I gave her a coupon card for the Premarin vaginal cream obviously it did not work.  Please call in the estradiol vaginal cream 0.02% 1 g per vagina 3 times weekly.  We also talked about this as well.  Thank you

## 2019-08-06 NOTE — Patient Instructions (Signed)
Vit D 2000 iu daily Pleasure knowing you! Health Maintenance for Postmenopausal Women Menopause is a normal process in which your ability to get pregnant comes to an end. This process happens slowly over many months or years, usually between the ages of 48 and 55. Menopause is complete when you have missed your menstrual periods for 12 months. It is important to talk with your health care provider about some of the most common conditions that affect women after menopause (postmenopausal women). These include heart disease, cancer, and bone loss (osteoporosis). Adopting a healthy lifestyle and getting preventive care can help to promote your health and wellness. The actions you take can also lower your chances of developing some of these common conditions. What should I know about menopause? During menopause, you may get a number of symptoms, such as:  Hot flashes. These can be moderate or severe.  Night sweats.  Decrease in sex drive.  Mood swings.  Headaches.  Tiredness.  Irritability.  Memory problems.  Insomnia. Choosing to treat or not to treat these symptoms is a decision that you make with your health care provider. Do I need hormone replacement therapy?  Hormone replacement therapy is effective in treating symptoms that are caused by menopause, such as hot flashes and night sweats.  Hormone replacement carries certain risks, especially as you become older. If you are thinking about using estrogen or estrogen with progestin, discuss the benefits and risks with your health care provider. What is my risk for heart disease and stroke? The risk of heart disease, heart attack, and stroke increases as you age. One of the causes may be a change in the body's hormones during menopause. This can affect how your body uses dietary fats, triglycerides, and cholesterol. Heart attack and stroke are medical emergencies. There are many things that you can do to help prevent heart disease and  stroke. Watch your blood pressure  High blood pressure causes heart disease and increases the risk of stroke. This is more likely to develop in people who have high blood pressure readings, are of African descent, or are overweight.  Have your blood pressure checked: ? Every 3-5 years if you are 18-39 years of age. ? Every year if you are 40 years old or older. Eat a healthy diet   Eat a diet that includes plenty of vegetables, fruits, low-fat dairy products, and lean protein.  Do not eat a lot of foods that are high in solid fats, added sugars, or sodium. Get regular exercise Get regular exercise. This is one of the most important things you can do for your health. Most adults should:  Try to exercise for at least 150 minutes each week. The exercise should increase your heart rate and make you sweat (moderate-intensity exercise).  Try to do strengthening exercises at least twice each week. Do these in addition to the moderate-intensity exercise.  Spend less time sitting. Even light physical activity can be beneficial. Other tips  Work with your health care provider to achieve or maintain a healthy weight.  Do not use any products that contain nicotine or tobacco, such as cigarettes, e-cigarettes, and chewing tobacco. If you need help quitting, ask your health care provider.  Know your numbers. Ask your health care provider to check your cholesterol and your blood sugar (glucose). Continue to have your blood tested as directed by your health care provider. Do I need screening for cancer? Depending on your health history and family history, you may need to have cancer   screening at different stages of your life. This may include screening for:  Breast cancer.  Cervical cancer.  Lung cancer.  Colorectal cancer. What is my risk for osteoporosis? After menopause, you may be at increased risk for osteoporosis. Osteoporosis is a condition in which bone destruction happens more  quickly than new bone creation. To help prevent osteoporosis or the bone fractures that can happen because of osteoporosis, you may take the following actions:  If you are 19-50 years old, get at least 1,000 mg of calcium and at least 600 mg of vitamin D per day.  If you are older than age 50 but younger than age 70, get at least 1,200 mg of calcium and at least 600 mg of vitamin D per day.  If you are older than age 70, get at least 1,200 mg of calcium and at least 800 mg of vitamin D per day. Smoking and drinking excessive alcohol increase the risk of osteoporosis. Eat foods that are rich in calcium and vitamin D, and do weight-bearing exercises several times each week as directed by your health care provider. How does menopause affect my mental health? Depression may occur at any age, but it is more common as you become older. Common symptoms of depression include:  Low or sad mood.  Changes in sleep patterns.  Changes in appetite or eating patterns.  Feeling an overall lack of motivation or enjoyment of activities that you previously enjoyed.  Frequent crying spells. Talk with your health care provider if you think that you are experiencing depression. General instructions See your health care provider for regular wellness exams and vaccines. This may include:  Scheduling regular health, dental, and eye exams.  Getting and maintaining your vaccines. These include: ? Influenza vaccine. Get this vaccine each year before the flu season begins. ? Pneumonia vaccine. ? Shingles vaccine. ? Tetanus, diphtheria, and pertussis (Tdap) booster vaccine. Your health care provider may also recommend other immunizations. Tell your health care provider if you have ever been abused or do not feel safe at home. Summary  Menopause is a normal process in which your ability to get pregnant comes to an end.  This condition causes hot flashes, night sweats, decreased interest in sex, mood swings,  headaches, or lack of sleep.  Treatment for this condition may include hormone replacement therapy.  Take actions to keep yourself healthy, including exercising regularly, eating a healthy diet, watching your weight, and checking your blood pressure and blood sugar levels.  Get screened for cancer and depression. Make sure that you are up to date with all your vaccines. This information is not intended to replace advice given to you by your health care provider. Make sure you discuss any questions you have with your health care provider. Document Revised: 04/04/2018 Document Reviewed: 04/04/2018 Elsevier Patient Education  2020 [employer] Inc[employer]  

## 2019-08-06 NOTE — Telephone Encounter (Signed)
Spoke with patient. She said [place] had already discussed the possiblity of getting the compounded estradiol cream and she is fine with that. Rx called to [facility].

## 2020-03-26 ENCOUNTER — Other Ambulatory Visit: Payer: No Typology Code available for payment source

## 2020-03-26 DIAGNOSIS — Z20822 Contact with and (suspected) exposure to covid-19: Secondary | ICD-10-CM

## 2020-03-28 LAB — SARS-COV-2, NAA 2 DAY TAT

## 2020-03-28 LAB — NOVEL CORONAVIRUS, NAA: SARS-CoV-2, NAA: NOT DETECTED

## 2020-04-14 ENCOUNTER — Other Ambulatory Visit: Payer: Self-pay | Admitting: Nurse Practitioner

## 2020-04-14 DIAGNOSIS — Z1231 Encounter for screening mammogram for malignant neoplasm of breast: Secondary | ICD-10-CM

## 2020-06-04 ENCOUNTER — Ambulatory Visit: Payer: No Typology Code available for payment source

## 2020-07-15 ENCOUNTER — Other Ambulatory Visit: Payer: Self-pay | Admitting: Nurse Practitioner

## 2020-07-15 DIAGNOSIS — M8580 Other specified disorders of bone density and structure, unspecified site: Secondary | ICD-10-CM

## 2020-07-16 ENCOUNTER — Other Ambulatory Visit: Payer: Self-pay | Admitting: Internal Medicine

## 2020-07-16 DIAGNOSIS — M8580 Other specified disorders of bone density and structure, unspecified site: Secondary | ICD-10-CM

## 2020-07-29 ENCOUNTER — Ambulatory Visit
Admission: RE | Admit: 2020-07-29 | Discharge: 2020-07-29 | Disposition: A | Discharge status: Home / Self Care | Payer: No Typology Code available for payment source | Source: Ambulatory Visit | Attending: Nurse Practitioner | Admitting: Nurse Practitioner

## 2020-07-29 DIAGNOSIS — Z1231 Encounter for screening mammogram for malignant neoplasm of breast: Secondary | ICD-10-CM

## 2020-08-06 ENCOUNTER — Encounter: Payer: 59 | Admitting: Nurse Practitioner

## 2020-09-14 ENCOUNTER — Other Ambulatory Visit: Payer: Self-pay

## 2020-09-14 ENCOUNTER — Ambulatory Visit
Admission: RE | Admit: 2020-09-14 | Discharge: 2020-09-14 | Disposition: A | Discharge status: Home / Self Care | Payer: No Typology Code available for payment source | Source: Ambulatory Visit | Attending: Internal Medicine | Admitting: Internal Medicine

## 2020-09-14 DIAGNOSIS — M8580 Other specified disorders of bone density and structure, unspecified site: Secondary | ICD-10-CM

## 2021-04-07 IMAGING — MG MM DIGITAL SCREENING BILAT W/ TOMO AND CAD
8 series · 9 of 24 positions shown · non-contrast
Comparison: Previous exam(s).

CLINICAL DATA: Screening.

EXAM:
DIGITAL SCREENING BILATERAL MAMMOGRAM WITH TOMOSYNTHESIS AND CAD
TECHNIQUE: Bilateral screening digital craniocaudal and mediolateral oblique
mammograms were obtained. Bilateral screening digital breast
tomosynthesis was performed. The images were evaluated with
computer-aided detection.

[L CC synth-2D]
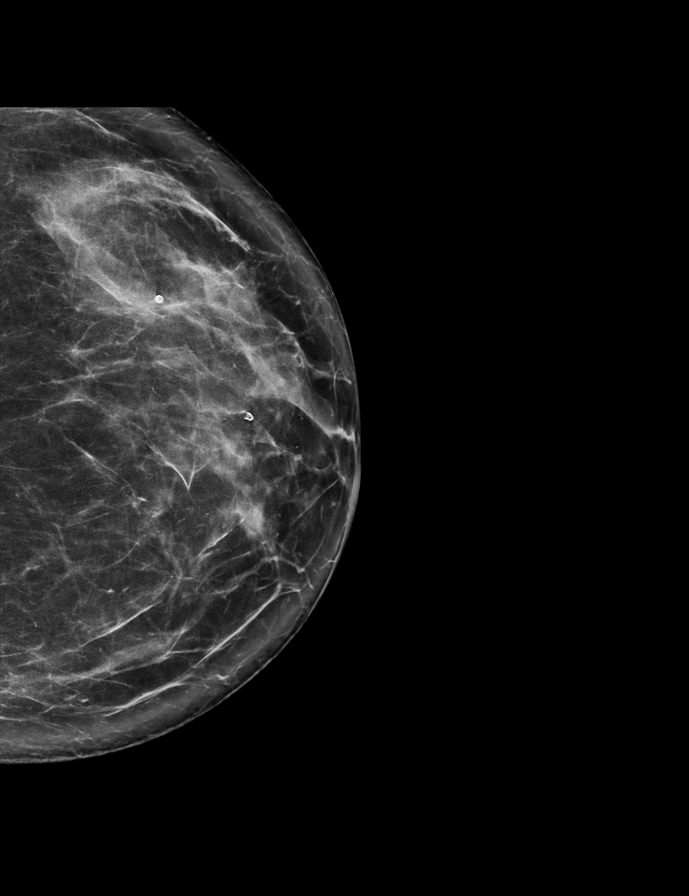

[L MLO synth-2D]
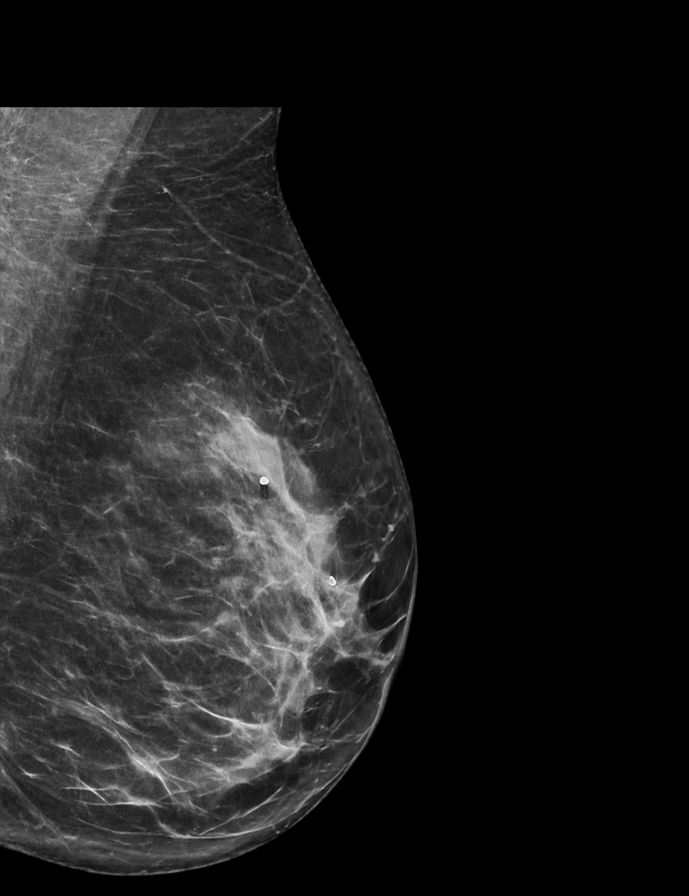

[R CC synth-2D]
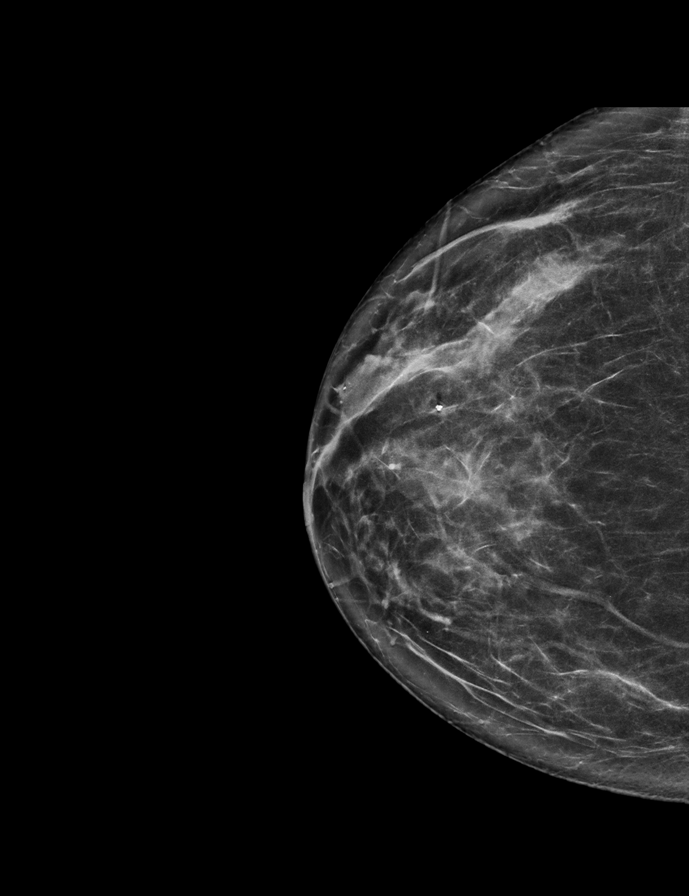

[R MLO synth-2D]
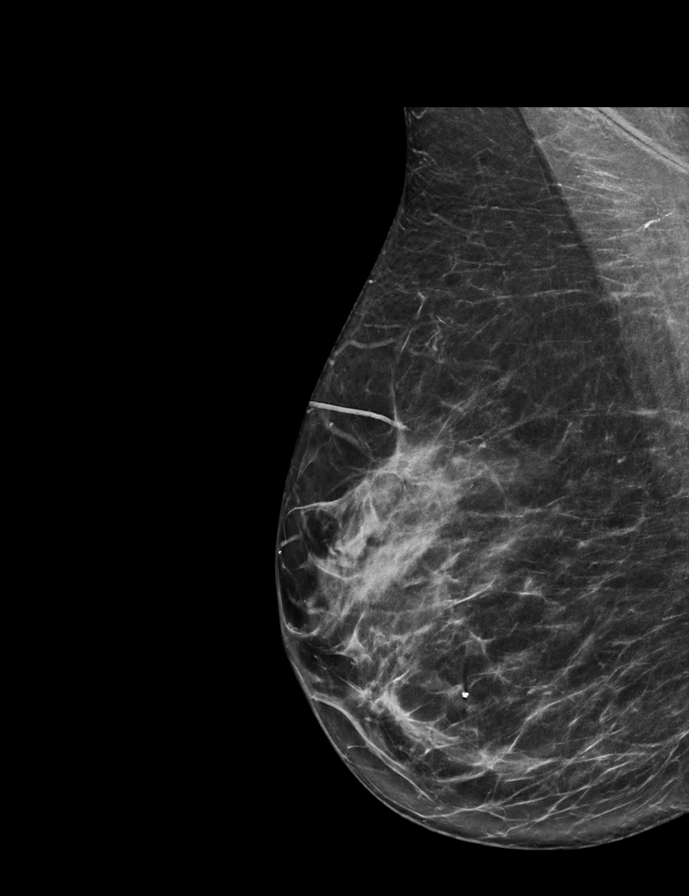

[R MLO tomo · 2 of 68 frames shown]
[frame 22/68]
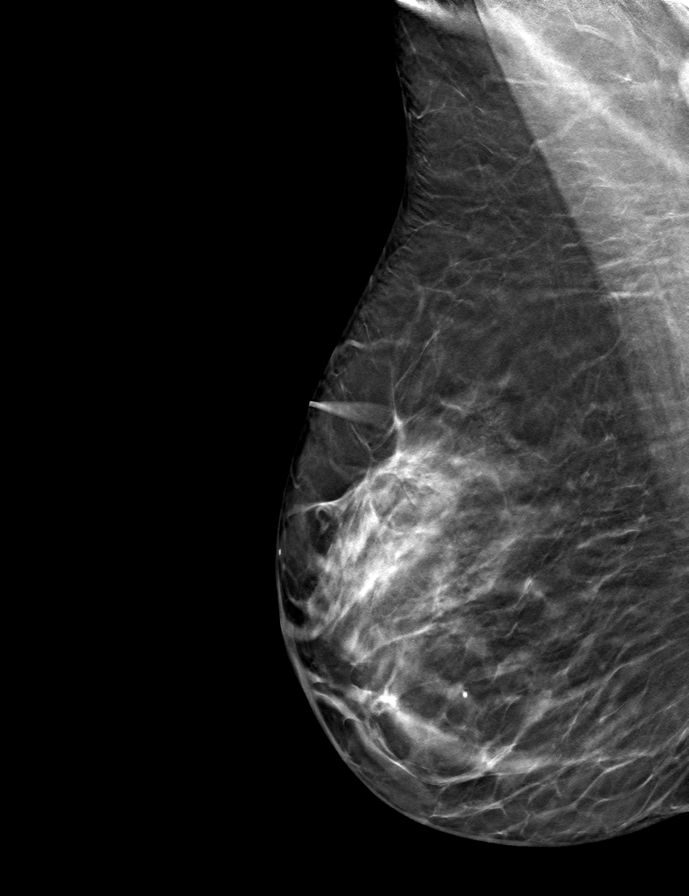
[frame 35/68]
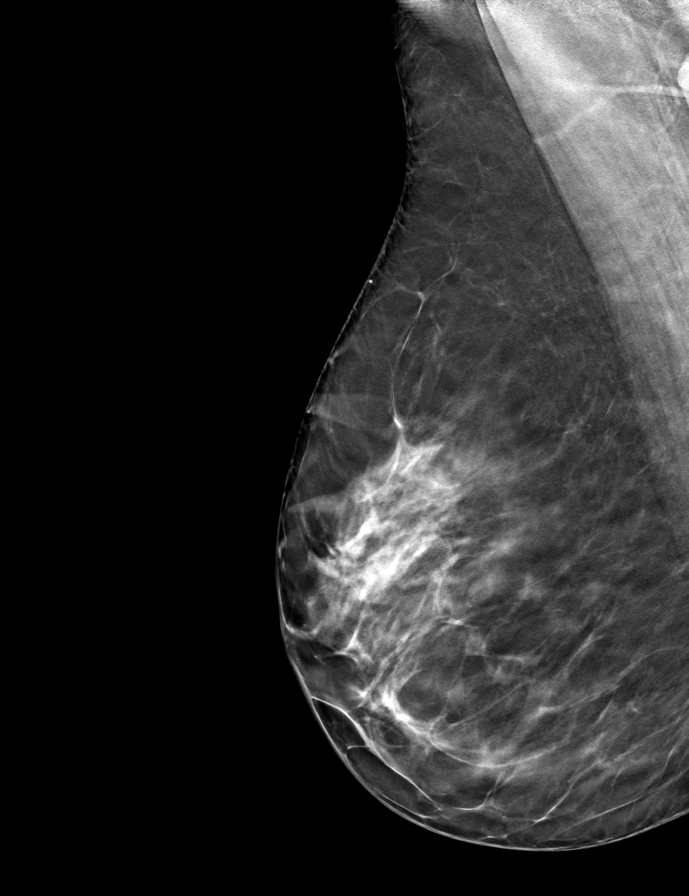

[L MLO tomo · tomo slice 35/70.0]
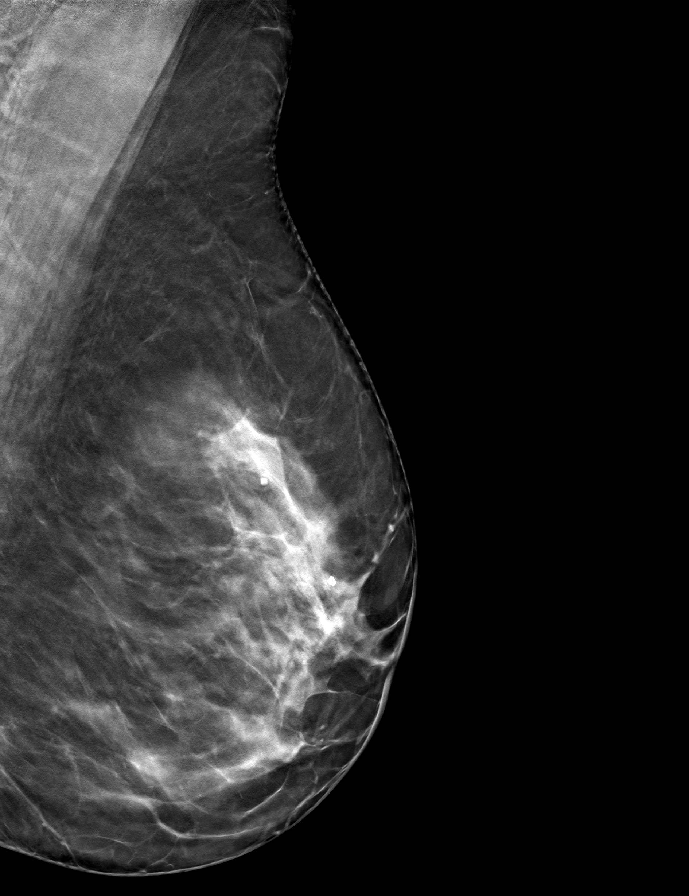

[R CC tomo · tomo slice 35/68.0]
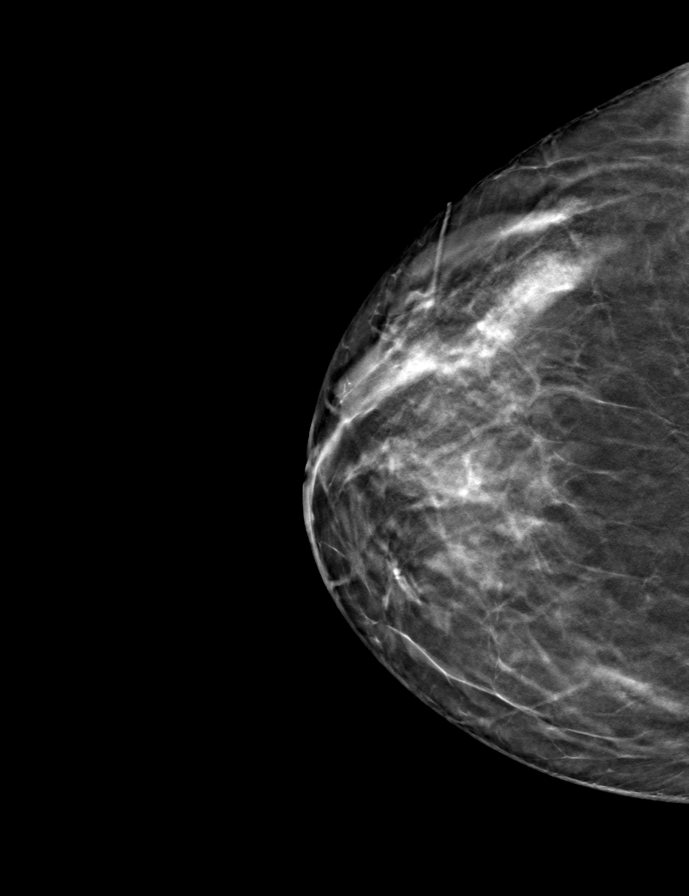

[L CC tomo · tomo slice 37/72.0]
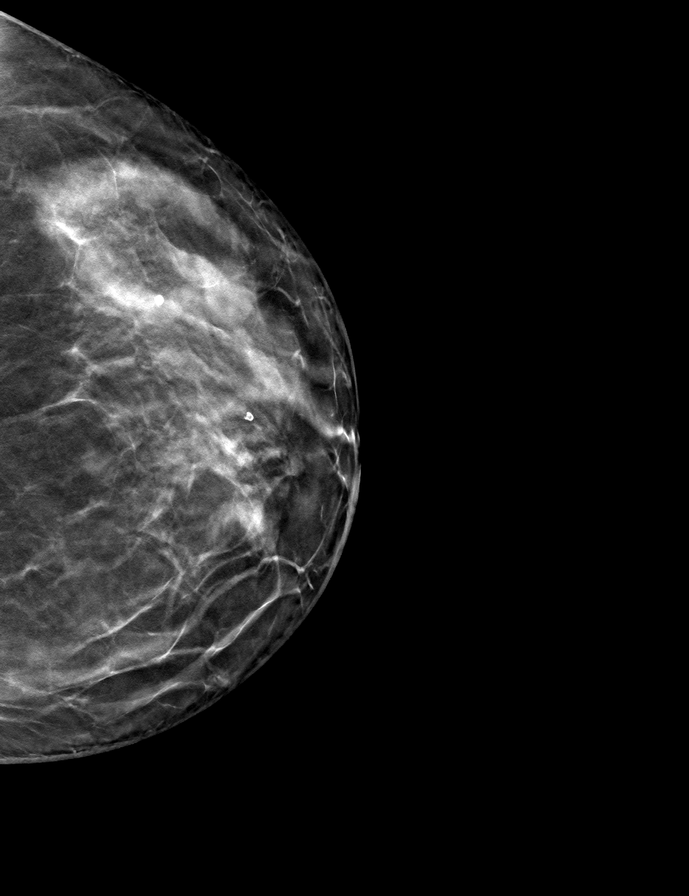

[9 of 24 positions shown; findings below may reference images not displayed]

ACR Breast Density Category c: The breast tissue is heterogeneously
dense, which may obscure small masses.
FINDINGS: There are no findings suspicious for malignancy. The images were
evaluated with computer-aided detection.
IMPRESSION: No mammographic evidence of malignancy. A result letter of this
screening mammogram will be mailed directly to the patient.

RECOMMENDATION:
Screening mammogram in one year. (Code:T4-5-GWO)

BI-RADS CATEGORY  1: Negative.

## 2021-09-29 ENCOUNTER — Other Ambulatory Visit: Payer: Self-pay | Admitting: Internal Medicine

## 2021-09-29 DIAGNOSIS — Z1231 Encounter for screening mammogram for malignant neoplasm of breast: Secondary | ICD-10-CM

## 2021-10-13 ENCOUNTER — Ambulatory Visit: Payer: No Typology Code available for payment source

## 2022-06-08 ENCOUNTER — Ambulatory Visit
Admission: RE | Admit: 2022-06-08 | Discharge: 2022-06-08 | Disposition: A | Discharge status: Home / Self Care | Payer: No Typology Code available for payment source | Source: Ambulatory Visit | Attending: Internal Medicine | Admitting: Internal Medicine

## 2022-06-08 DIAGNOSIS — Z1231 Encounter for screening mammogram for malignant neoplasm of breast: Secondary | ICD-10-CM

## 2022-08-11 ENCOUNTER — Other Ambulatory Visit: Payer: Self-pay | Admitting: Internal Medicine

## 2022-08-11 DIAGNOSIS — M8580 Other specified disorders of bone density and structure, unspecified site: Secondary | ICD-10-CM

## 2023-05-17 ENCOUNTER — Encounter: Payer: Self-pay | Admitting: Internal Medicine

## 2023-05-17 ENCOUNTER — Other Ambulatory Visit: Payer: Self-pay | Admitting: Internal Medicine

## 2023-05-17 DIAGNOSIS — Z1231 Encounter for screening mammogram for malignant neoplasm of breast: Secondary | ICD-10-CM

## 2023-06-12 ENCOUNTER — Ambulatory Visit: Payer: No Typology Code available for payment source

## 2023-06-21 ENCOUNTER — Ambulatory Visit
Admission: RE | Admit: 2023-06-21 | Discharge: 2023-06-21 | Disposition: A | Discharge status: Home / Self Care | Payer: Medicare Other | Source: Ambulatory Visit | Attending: Internal Medicine

## 2023-06-21 DIAGNOSIS — Z1231 Encounter for screening mammogram for malignant neoplasm of breast: Secondary | ICD-10-CM

## 2023-08-22 NOTE — Progress Notes (Deleted)
 68 y.o. G63P2002 Married Caucasian female here for a new pt breast and pelvic exam.    The patient is also followed for ***.  PCP: Azalia Leo, MD   No LMP recorded. Patient is postmenopausal.           Sexually active: {yes no:314532}  The current method of family planning is post menopausal status.    Menopausal hormone therapy: Compounded Estrogen Cream Exercising: {yes no:314532}  {types:19826} Smoker:  no  OB History     Gravida  2   Para  2   Term  2   Preterm      AB  0   Living  2      SAB  0   IAB      Ectopic  0   Multiple      Live Births              HEALTH MAINTENANCE: Last 2 paps: 2016-WNL, 07/03/2018-WNL, HPV- neg  History of abnormal Pap or positive HPV:  yes, many years ago and did not require intervention. Mammogram: 06/21/2023-WNL, Cat C Colonoscopy: 12/2021, Due   Bone Density: 09/14/2020  Result osteopenia    There is no immunization history on file for this patient.    reports that she has never smoked. She has never used smokeless tobacco. She reports current alcohol use. She reports that she does not use drugs.  Past Medical History:  Diagnosis Date   Atrophic vaginitis    Cervical dysplasia    Connective tissue disease (HCC)    Osteopenia    Raynaud's syndrome    Sjogren's disease (HCC)     Past Surgical History:  Procedure Laterality Date   APPENDECTOMY     BREAST EXCISIONAL BIOPSY Right    benign   CHOLECYSTECTOMY     COLPOSCOPY     GYNECOLOGIC CRYOSURGERY     KNEE SURGERY     TONSILLECTOMY     TUBAL LIGATION      Current Outpatient Medications  Medication Sig Dispense Refill   Cholecalciferol (VITAMIN D PO) Take by mouth.     Hydroxychloroquine Sulfate (PLAQUENIL PO) Take by mouth.     NONFORMULARY OR COMPOUNDED ITEM Estradiol  vaginal cream 0.02% S: insert one gram intravaginally hs 3 times weekly. 3 mos supply with 3 refills. 36 each 3   Pseudoephedrine HCl (SUDAFED 12 HOUR PO) Take by mouth.      No current facility-administered medications for this visit.    ALLERGIES: Ceftin [cefuroxime axetil] and Levaquin [levofloxacin in d5w]  Family History  Problem Relation Age of Onset   Osteoporosis Mother    Hypertension Father    Hypertension Sister    Breast cancer Paternal Grandmother        Age 10    Review of Systems  PHYSICAL EXAM:  There were no vitals taken for this visit.    General appearance: alert, cooperative and appears stated age Head: normocephalic, without obvious abnormality, atraumatic Neck: no adenopathy, supple, symmetrical, trachea midline and thyroid normal to inspection and palpation Lungs: clear to auscultation bilaterally Breasts: normal appearance, no masses or tenderness, No nipple retraction or dimpling, No nipple discharge or bleeding, No axillary adenopathy Heart: regular rate and rhythm Abdomen: soft, non-tender; no masses, no organomegaly Extremities: extremities normal, atraumatic, no cyanosis or edema Skin: skin color, texture, turgor normal. No rashes or lesions Lymph nodes: cervical, supraclavicular, and axillary nodes normal. Neurologic: grossly normal  Pelvic: External genitalia:  no lesions  No abnormal inguinal nodes palpated.              Urethra:  normal appearing urethra with no masses, tenderness or lesions              Bartholins and Skenes: normal                 Vagina: normal appearing vagina with normal color and discharge, no lesions              Cervix: no lesions              Pap taken: {yes no:314532} Bimanual Exam:  Uterus:  normal size, contour, position, consistency, mobility, non-tender              Adnexa: no mass, fullness, tenderness              Rectal exam: {yes no:314532}.  Confirms.              Anus:  normal sphincter tone, no lesions  Chaperone was present for exam:  {BSCHAPERONE:31226::"Emily F, CMA"}  ASSESSMENT: Encounter for breast and pelvic exam.   ***  PLAN: Mammogram screening  discussed. Self breast awareness reviewed. Pap and HRV collected:  {yes no:314532} Guidelines for Calcium, Vitamin D, regular exercise program including cardiovascular and weight bearing exercise. Medication refills:  *** {LABS (Optional):23779} Follow up:  ***    Additional counseling given.  {yes [id number]. ***  total time was spent for this patient encounter, including preparation, face-to-face counseling with the patient, coordination of care, and documentation of the encounter in addition to doing the breast and pelvic exam.

## 2023-08-23 ENCOUNTER — Encounter: Payer: 59 | Admitting: Obstetrics and Gynecology

## 2023-10-18 ENCOUNTER — Other Ambulatory Visit (HOSPITAL_COMMUNITY)
Admission: RE | Admit: 2023-10-18 | Discharge: 2023-10-18 | Disposition: A | Discharge status: Home / Self Care | Source: Ambulatory Visit | Attending: Obstetrics and Gynecology | Admitting: Obstetrics and Gynecology

## 2023-10-18 ENCOUNTER — Ambulatory Visit (INDEPENDENT_AMBULATORY_CARE_PROVIDER_SITE_OTHER): Admitting: Obstetrics and Gynecology

## 2023-10-18 ENCOUNTER — Encounter: Payer: Self-pay | Admitting: Obstetrics and Gynecology

## 2023-10-18 VITALS — BP 114/72 | HR 82 | Ht 65.0 in | Wt 131.0 lb

## 2023-10-18 DIAGNOSIS — Z01419 Encounter for gynecological examination (general) (routine) without abnormal findings: Secondary | ICD-10-CM | POA: Insufficient documentation

## 2023-10-18 DIAGNOSIS — Z1331 Encounter for screening for depression: Secondary | ICD-10-CM | POA: Diagnosis not present

## 2023-10-18 DIAGNOSIS — Z124 Encounter for screening for malignant neoplasm of cervix: Secondary | ICD-10-CM

## 2023-10-18 DIAGNOSIS — N952 Postmenopausal atrophic vaginitis: Secondary | ICD-10-CM

## 2023-10-18 DIAGNOSIS — Z1151 Encounter for screening for human papillomavirus (HPV): Secondary | ICD-10-CM | POA: Diagnosis not present

## 2023-10-18 NOTE — Patient Instructions (Addendum)
 Vaginal Dryness and Thinning (Atrophic Vaginitis): What to Know  Atrophic vaginitis is a condition where the lining of the vagina becomes thin, dry, and inflamed. This condition can make you more likely to: Get an infection. Have pain during sex. Have other uncomfortable symptoms. Tell your health care provider if you notice any changes in your vagina. They can help you find ways to feel better. They can also treat infections and suggest healthy habits for a healthy vagina. What are the causes? Atrophic vaginitis is caused by a drop in estrogen. It's more common when menstrual periods stop during menopause. This often starts between ages 34-55 and may get worse over time as estrogen levels get lower. Estrogen helps to: Keep the vagina moist. Lubricate the vagina during sex. Protect against infection. A drop in estrogen can lead to: Thinner and drier vaginal lining. A smaller, less stretchy vagina. What increases the risk? Many factors make you more likely to have vaginal dryness and thinning. These include: Taking medicines that block estrogen. Having your ovaries taken out. Cancer treatment, such as: Radiation. Chemotherapy. Having recently delivered a baby, especially if not a vaginal delivery. Breastfeeding. Being over age 41. Having an eating disorder. Smoking. What are the signs or symptoms? Pain, soreness, or bleeding during sex. Burning, irritation, or itching around your vagina. Loss of interest in sex. Burning pain while peeing or peeing often. Vaginal discharge. It may be: White. Martina Sledge. Yellow. Blood-tinged. Thick. Watery. Sometimes, there are no symptoms. How is this diagnosed? This condition is diagnosed based on: Your medical history. A pelvic exam to check the tissue in the vagina. Rarely, you may have more tests. These include: A pee test to check for a urinary tract infection. An acid balance check in the vagina. How is this treated? Treatment depends  on your symptoms. Treatment may include: Lubricants for the vagina. Long-acting moisturizers. Low-dose estrogen. These come in: Creams. Tablets. Vaginal rings. Treatment may not be needed if your symptoms are mild and you're not having sex. Talk to your provider about any history of breast cancer, endometrial cancer, or blood clots. Follow these instructions at home: Medicines Take your medicines only as told. Do not use herbal or other medicines unless your provider says it's safe. Use creams, lubricants, or moisturizers only as told. General instructions If your dryness and thinning is related to menopause, talk about your symptoms and treatment options with your provider. Do not douche. Do not use products that make your vagina dry. These include: Scented sprays. Tampons. Soaps. Use water-soluble lubricant or moisturizer if sex is painful. Having sex more often can improve blood flow and make the vaginal tissue more stretchy. Contact a health care provider if: Your discharge from your vagina changes in color and has a smell. You have new symptoms. Your symptoms don't get better with treatment. Your symptoms get worse. This information is not intended to replace advice given to you by your health care provider. Make sure you discuss any questions you have with your health care provider. Document Revised: 11/21/2022 Document Reviewed: 11/21/2022 Elsevier Patient Education  2024 [employer].  EXERCISE AND DIET:  We recommended that you start or continue a regular exercise program for good health. Regular exercise means any activity that makes your heart beat faster and makes you sweat.  We recommend exercising at least 30 minutes per day at least 3 days a week, preferably 4 or 5.  We also recommend a diet low in fat and sugar.  Inactivity, poor dietary choices  and obesity can cause diabetes, heart attack, stroke, and kidney damage, among others.    ALCOHOL AND SMOKING:  Women  should limit their alcohol intake to no more than 7 drinks/beers/glasses of wine (combined, not each!) per week. Moderation of alcohol intake to this level decreases your risk of breast cancer and liver damage. And of course, no recreational drugs are part of a healthy lifestyle.  And absolutely no smoking or even second hand smoke. Most people know smoking can cause heart and lung diseases, but did you know it also contributes to weakening of your bones? Aging of your skin?  Yellowing of your teeth and nails?  CALCIUM AND VITAMIN D:  Adequate intake of calcium and Vitamin D are recommended.  The recommendations for exact amounts of these supplements seem to change often, but generally speaking 600 mg of calcium (either carbonate or citrate) and 800 units of Vitamin D per day seems prudent. Certain women may benefit from higher intake of Vitamin D.  If you are among these women, your doctor will have told you during your visit.    PAP SMEARS:  Pap smears, to check for cervical cancer or precancers,  have traditionally been done yearly, although recent scientific advances have shown that most women can have pap smears less often.  However, every woman still should have a physical exam from her gynecologist every year. It will include a breast check, inspection of the vulva and vagina to check for abnormal growths or skin changes, a visual exam of the cervix, and then an exam to evaluate the size and shape of the uterus and ovaries.  And after 66 years of age, a rectal exam is indicated to check for rectal cancers. We will also provide age appropriate advice regarding health maintenance, like when you should have certain vaccines, screening for sexually transmitted diseases, bone density testing, colonoscopy, mammograms, etc.   MAMMOGRAMS:  All women over 68 years old should have a yearly mammogram. Many facilities now offer a "3D" mammogram, which may cost around $50 extra out of pocket. If possible,  we  recommend you accept the option to have the 3D mammogram performed.  It both reduces the number of women who will be called back for extra views which then turn out to be normal, and it is better than the routine mammogram at detecting truly abnormal areas.    COLONOSCOPY:  Colonoscopy to screen for colon cancer is recommended for all women at age 46.  We know, you hate the idea of the prep.  We agree, BUT, having colon cancer and not knowing it is worse!!  Colon cancer so often starts as a polyp that can be seen and removed at colonscopy, which can quite literally save your life!  And if your first colonoscopy is normal and you have no family history of colon cancer, most women don't have to have it again for 10 years.  Once every ten years, you can do something that may end up saving your life, right?  We will be happy to help you get it scheduled when you are ready.  Be sure to check your insurance coverage so you understand how much it will cost.  It may be covered as a preventative service at no cost, but you should check your particular policy.

## 2023-10-18 NOTE — Progress Notes (Signed)
 67 y.o. G66P2002 Married Caucasian female here for breast and pelvic exam.   Would like a pap today.  No bleeding or spotting.  Has some vaginal dryness.   Married for 43 years.  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  Has 2 daughters.  Has grandchildren.  Mother is 18 yo, living independently.  Father passed 1.5 years ago.   PCP: Stephane Leita DEL, MD  Dr. Balinda.   No LMP recorded. Patient is postmenopausal.           Sexually active: Yes.    The current method of family planning is tubal ligation.    Menopausal hormone therapy:  n/a  Exercising: Yes.    Cardio 3x week, core class 2x week, tennis 1x week, hiking and walking  Smoker:  no  OB History  Gravida Para Term Preterm AB Living  2 2 2   0 2  SAB IAB Ectopic Multiple Live Births  0  0      # Outcome Date GA Lbr Len/2nd Weight Sex Type Anes PTL Lv  2 Term           1 Term              HEALTH MAINTENANCE: Last 2 paps:  08/21/14 neg, 03/14/12 neg  History of abnormal Pap or positive HPV:  no Mammogram:   06/21/23 Breast Density Cat C, BIRADS Cat 1 neg  Colonoscopy:  2024 - polyps, return in 5 years per patient.  Dr. Ladora, [facility].  Bone Density:  09/14/20  Result  osteopenic.  Just had BMD done at PCP office   Immunization History  Administered Date(s) Administered   Influenza Inj Mdck Quad Pf 03/17/2019   Influenza,inj,Quad PF,6+ Mos 01/27/2018   Influenza-Unspecified 02/02/2015   Tdap 04/29/2019      reports that she has never smoked. She has never used smokeless tobacco. She reports current alcohol use. She reports that she does not use drugs.  Past Medical History:  Diagnosis Date   Atrophic vaginitis    Cervical dysplasia    Connective tissue disease (HCC)    Osteopenia    Raynaud's syndrome    Sjogren's disease (HCC)     Past Surgical History:  Procedure Laterality Date   APPENDECTOMY     BREAST EXCISIONAL BIOPSY Right    benign   CHOLECYSTECTOMY     COLPOSCOPY     GYNECOLOGIC  CRYOSURGERY     KNEE SURGERY     TONSILLECTOMY     TUBAL LIGATION      Current Outpatient Medications  Medication Sig Dispense Refill   Cholecalciferol (VITAMIN D PO) Take by mouth.     Fexofenadine HCl (ALLEGRA PO) Take by mouth.     MAGNESIUM PO Take 250 mg by mouth.     chlorhexidine (PERIDEX) 0.12 % solution 1 mL. (Patient not taking: Reported on 10/18/2023)     Cholecalciferol 50 MCG (2000 UT) TABS 1 tablet Orally Once a day for 30 day(s) (Patient not taking: Reported on 10/18/2023)     Hydroxychloroquine Sulfate (PLAQUENIL PO) Take by mouth. (Patient not taking: Reported on 10/18/2023)     ibuprofen (ADVIL) 200 MG tablet Take 800 mg by mouth. (Patient not taking: Reported on 10/18/2023)     Pseudoephedrine HCl (SUDAFED 12 HOUR PO) Take by mouth. (Patient not taking: Reported on 10/18/2023)     No current facility-administered medications for this visit.    ALLERGIES: Ceftin [cefuroxime axetil] and Levaquin [levofloxacin in d5w]  Family History  Problem Relation Age of Onset   Osteoporosis Mother    Hypertension Father    Hypertension Sister    Breast cancer Paternal Grandmother        Age 10    Review of Systems  All other systems reviewed and are negative.   PHYSICAL EXAM:  BP 114/72 (BP Location: Left Arm, Patient Position: Sitting)   Pulse 82   Ht 5' 5 (1.651 m)   Wt 131 lb (59.4 kg)   SpO2 99%   BMI 21.80 kg/m     General appearance: alert, cooperative and appears stated age Head: normocephalic, without obvious abnormality, atraumatic Neck: no adenopathy, supple, symmetrical, trachea midline and thyroid normal to inspection and palpation Lungs: clear to auscultation bilaterally Breasts: normal appearance, no masses or tenderness, No nipple retraction or dimpling, No nipple discharge or bleeding, No axillary adenopathy Heart: regular rate and rhythm Abdomen: soft, non-tender; no masses, no organomegaly Extremities: extremities normal, atraumatic, no cyanosis or  edema Skin: skin color, texture, turgor normal. No rashes or lesions Lymph nodes: cervical, supraclavicular, and axillary nodes normal. Neurologic: grossly normal  Pelvic: External genitalia:  no lesions              No abnormal inguinal nodes palpated.              Urethra:  normal appearing urethra with no masses, tenderness or lesions              Bartholins and Skenes: normal                 Vagina: normal appearing vagina with normal color and discharge, no lesions              Cervix: no lesions              Pap taken: yes Bimanual Exam:  Uterus:  normal size, contour, position, consistency, mobility, non-tender              Adnexa: no mass, fullness, tenderness              Rectal exam: yes.  Confirms.              Anus:  normal sphincter tone, no lesions  Chaperone was present for exam:  Kari HERO, CMA  ASSESSMENT: Well woman visit with gynecologic exam. Cervical cancer screening.  Vaginal atrophy.  PHQ-2-9: 0  PLAN: Mammogram screening discussed. Self breast awareness reviewed. Pap and HRV collected:  yes Guidelines for Calcium, Vitamin D, regular exercise program including cardiovascular and weight bearing exercise. Medication refills:  none. We discussed treatment options for atrophy:  Uberlube, Good Clean Love products, cooking oils, vaginal estrogens , intrarosa.  Patient currently declines a prescription option.  Follow up:  2 years for breast and pelvic exam and prn.    Additional counseling given.  yes. 15 min  total time was spent for this patient encounter, including preparation, face-to-face counseling with the patient, coordination of care, and documentation of the encounter in addition to doing the breast and pelvic exam.

## 2023-10-20 LAB — CYTOLOGY - PAP
Comment: NEGATIVE
Diagnosis: NEGATIVE
High risk HPV: NEGATIVE

## 2023-10-26 ENCOUNTER — Ambulatory Visit: Payer: Self-pay | Admitting: Obstetrics and Gynecology
